# Patient Record
Sex: Female | Born: 1960 | Race: Black or African American | Hispanic: No | Marital: Single | Smoking: Never smoker
Health system: Southern US, Community
[De-identification: ages and names within clinical notes are randomized; demographics above are authoritative.]

## PROBLEM LIST (undated history)

## (undated) DIAGNOSIS — I872 Venous insufficiency (chronic) (peripheral): Secondary | ICD-10-CM

---

## 1998-03-22 ENCOUNTER — Other Ambulatory Visit: Admission: RE | Admit: 1998-03-22 | Discharge: 1998-03-22 | Payer: Self-pay | Admitting: Obstetrics and Gynecology

## 1999-07-04 ENCOUNTER — Other Ambulatory Visit: Admission: RE | Admit: 1999-07-04 | Discharge: 1999-07-04 | Payer: Self-pay | Admitting: Obstetrics and Gynecology

## 2000-07-29 ENCOUNTER — Other Ambulatory Visit: Admission: RE | Admit: 2000-07-29 | Discharge: 2000-07-29 | Payer: Self-pay | Admitting: Obstetrics and Gynecology

## 2001-09-01 ENCOUNTER — Other Ambulatory Visit: Admission: RE | Admit: 2001-09-01 | Discharge: 2001-09-01 | Payer: Self-pay | Admitting: Obstetrics and Gynecology

## 2002-09-07 ENCOUNTER — Other Ambulatory Visit: Admission: RE | Admit: 2002-09-07 | Discharge: 2002-09-07 | Payer: Self-pay | Admitting: Obstetrics and Gynecology

## 2003-10-15 ENCOUNTER — Other Ambulatory Visit: Admission: RE | Admit: 2003-10-15 | Discharge: 2003-10-15 | Payer: Self-pay | Admitting: Obstetrics and Gynecology

## 2017-12-02 ENCOUNTER — Ambulatory Visit (HOSPITAL_COMMUNITY)
Admission: EM | Admit: 2017-12-02 | Discharge: 2017-12-02 | Disposition: A | Discharge status: Home / Self Care | Payer: Self-pay | Attending: Family Medicine | Admitting: Family Medicine

## 2017-12-02 ENCOUNTER — Encounter (HOSPITAL_COMMUNITY): Payer: Self-pay

## 2017-12-02 DIAGNOSIS — M7989 Other specified soft tissue disorders: Secondary | ICD-10-CM

## 2017-12-02 LAB — POCT I-STAT, CHEM 8
BUN: 11 mg/dL (ref 6–20)
CHLORIDE: 102 mmol/L (ref 98–111)
CREATININE: 0.6 mg/dL (ref 0.44–1.00)
Calcium, Ion: 1.21 mmol/L (ref 1.15–1.40)
Glucose, Bld: 94 mg/dL (ref 70–99)
HEMATOCRIT: 31 % — AB (ref 36.0–46.0)
Hemoglobin: 10.5 g/dL — ABNORMAL LOW (ref 12.0–15.0)
POTASSIUM: 4 mmol/L (ref 3.5–5.1)
SODIUM: 137 mmol/L (ref 135–145)
TCO2: 22 mmol/L (ref 22–32)

## 2017-12-02 NOTE — ED Provider Notes (Signed)
[facility]    CSN: [id number] Arrival date & time: 12/02/17  1809     History   Chief Complaint Chief Complaint  Patient presents with  . left leg swelling and swelling in ankle along with a slight     HPI Karmen Stabserrie A Ligman is a 57 y.o. female.   57 year old female comes in with left leg and ankle swelling for the past month that is causing discoloration to the leg.  States swelling improves throughout the night, and worsens during the day.  No significant pain, itching, tightness. States symptoms slightly improved these past few days, but given has been present the past month, came in for evaluation. She denies chest pain, shortness of breath, palpitations, weakness, dizziness, syncope. No recent long travels. No oral birth control use. Never smoker and denies history of drug use. Work requires long hours of standing and walking. Has not taken anything for the symptoms.      History reviewed. No pertinent past medical history.  There are no active problems to display for this patient.   History reviewed. No pertinent surgical history.  OB History   None      Home Medications    Prior to Admission medications   Not on File    Family History History reviewed. No pertinent family history.  Social History Social History   Tobacco Use  . Smoking status: Not on file  Substance Use Topics  . Alcohol use: Not on file  . Drug use: Not on file     Allergies   Patient has no allergy information on record.   Review of Systems Review of Systems  Reason unable to perform ROS: See HPI as above.     Physical Exam Triage Vital Signs ED Triage Vitals  Enc Vitals Group     BP 12/02/17 1825 (!) 177/131     Pulse Rate 12/02/17 1825 (!) 117     Resp 12/02/17 1825 20     Temp 12/02/17 1825 98.6 F (37 C)     Temp Source 12/02/17 1825 Oral     SpO2 12/02/17 1825 100 %     Weight --      Height --      Head Circumference --      Peak Flow --    Pain Score 12/02/17 1823 5     Pain Loc --      Pain Edu? --      Excl. in GC? --    No data found.  Updated Vital Signs BP (!) 168/122   Pulse (!) 117   Temp 98.6 F (37 C) (Oral)   Resp 20   LMP  (LMP Unknown)   SpO2 100%   Physical Exam  Constitutional: She is oriented to person, place, and time. She appears well-developed and well-nourished. No distress.  HENT:  Head: Normocephalic and atraumatic.  Eyes: Pupils are equal, round, and reactive to light. Conjunctivae are normal.  Musculoskeletal:  Swelling of the left ankle with pitting edema up to proximal one third of shin.  No calf swelling.  No erythema, increased warmth.  Hyperpigmentation of bilateral shin, medial aspect of the ankle.  No ulcers seen. No tenderness to palpation of ankle, foot, knee.  Full range of motion of ankle, knee.  Strength normal and equal bilaterally.  Sensation intact and equal bilaterally.  Negative Homans.  Pedal pulse 2+ and equal bilaterally.  Neurological: She is alert and oriented to person, place, and time.  Skin:  She is not diaphoretic.     UC Treatments / Results  Labs (all labs ordered are listed, but only abnormal results are displayed) Labs Reviewed  POCT I-STAT, CHEM 8 - Abnormal; Notable for the following components:      Result Value   Hemoglobin 10.5 (*)    HCT 31.0 (*)    All other components within normal limits    EKG None  Radiology No results found.  Procedures Procedures (including critical care time)  Medications Ordered in UC Medications - No data to display  Initial Impression / Assessment and Plan / UC Course  I have reviewed the triage vital signs and the nursing notes.  Pertinent labs & imaging results that were available during my care of the patient were reviewed by me and considered in my medical decision making (see chart for details).    Discussed case with Dr. Dayton Scrape.  57 year old female with 1 month history of left leg and ankle swelling and  hyperpigmentation of bilateral lower legs.  Swelling improves after elevation.  Patient was at first tachycardic and hypertensive at triage.  During EKG,  tachycardia improved to 104.  EKG shows normal sinus rhythm with 1 to 4 bpm, no acute ST changes.  There is pitting edema of the left lower leg without erythema, increased warmth.  No swelling of the calf, negative Homans.  We will still obtain DVT study tomorrow as scheduled.  Patient to wear compression stockings for possible venous stasis.  Patient to follow-up with PCP for further evaluation and management of possible hypertension.  Can return here in 1 to 2 weeks if unable to obtain PCP up appointment for further evaluation of elevated blood pressure reading.  Strict return precautions given.  Patient expresses understanding and agrees to plan.  Discussed  case with Dr. Dayton Scrape, who agrees to plan.  Final Clinical Impressions(s) / UC Diagnoses   Final diagnoses:  Left leg swelling    ED Prescriptions    None        Belinda Fisher, PA-C 12/02/17 2048

## 2017-12-02 NOTE — Discharge Instructions (Signed)
No alarming signs on exam today.  Your blood pressure and heart rate has decreased slightly.  Please follow-up tomorrow for DVT study to rule out a blood clot in your leg.  Compression stockings to help with swelling and elevation of your leg. As discussed, please follow-up with PCP or vascular for further evaluation and management of leg swelling, discoloration, high blood pressure.  If you cannot follow-up with the PCP in 1 to 2 weeks, you can follow-up here for recheck of blood pressure and possibly starting blood pressure medicine.  If experiencing worsening symptoms, chest pain, shortness of breath, weakness, dizziness, palpitations, go to the emergency department for further evaluation.

## 2017-12-02 NOTE — ED Triage Notes (Signed)
Pt presents with swelling in left ankle and leg ;along with a rash that is causing discoloration.

## 2017-12-03 ENCOUNTER — Ambulatory Visit (HOSPITAL_COMMUNITY)
Admission: RE | Admit: 2017-12-03 | Discharge: 2017-12-03 | Disposition: A | Discharge status: Home / Self Care | Payer: Self-pay | Source: Ambulatory Visit | Attending: Physician Assistant | Admitting: Physician Assistant

## 2017-12-03 DIAGNOSIS — M7989 Other specified soft tissue disorders: Secondary | ICD-10-CM

## 2017-12-03 DIAGNOSIS — R609 Edema, unspecified: Secondary | ICD-10-CM

## 2017-12-03 DIAGNOSIS — R600 Localized edema: Secondary | ICD-10-CM | POA: Insufficient documentation

## 2017-12-03 NOTE — Progress Notes (Signed)
Left lower extremity venous duplex completed. There is no evidence of a DVT or Baker's cyst. There is evidence of emlargement of the inguinal lymph nodes. [place] Electric[place]ia Chrishon Martino, RVS 12/03/2017 9:05 AM

## 2019-05-09 ENCOUNTER — Encounter (HOSPITAL_COMMUNITY): Payer: Self-pay | Admitting: *Deleted

## 2019-05-09 ENCOUNTER — Other Ambulatory Visit: Payer: Self-pay

## 2019-05-09 ENCOUNTER — Ambulatory Visit (HOSPITAL_COMMUNITY)
Admission: EM | Admit: 2019-05-09 | Discharge: 2019-05-09 | Disposition: A | Discharge status: Home / Self Care | Payer: Self-pay | Attending: Family Medicine | Admitting: Family Medicine

## 2019-05-09 DIAGNOSIS — R030 Elevated blood-pressure reading, without diagnosis of hypertension: Secondary | ICD-10-CM

## 2019-05-09 DIAGNOSIS — R42 Dizziness and giddiness: Secondary | ICD-10-CM

## 2019-05-09 HISTORY — DX: Venous insufficiency (chronic) (peripheral): I87.2

## 2019-05-09 MED ORDER — CLONIDINE HCL 0.1 MG PO TABS
ORAL_TABLET | ORAL | Status: AC
  Filled 2019-05-09: qty 1

## 2019-05-09 MED ORDER — CLONIDINE HCL 0.1 MG PO TABS
0.1000 mg | ORAL_TABLET | Freq: Once | ORAL | Status: AC
  Administered 2019-05-09: 0.1 mg via ORAL

## 2019-05-09 MED ORDER — AMLODIPINE BESYLATE 5 MG PO TABS: 5.0000 mg | ORAL_TABLET | Freq: Every day | ORAL | 0 refills | Status: AC

## 2019-05-09 NOTE — ED Notes (Signed)
Dr Mannie Stabile notified of pt's VS and c/o's.

## 2019-05-09 NOTE — Discharge Instructions (Addendum)
Begin taking the blood pressure medicine prescribed.  lease seek prompt medical care if: You have: A very bad (severe) headache that is not helped by medicine. Trouble walking or weakness in your arms and legs. Clear or bloody fluid coming from your nose or ears. Changes in your seeing (vision). Jerky movements that you cannot control (seizure). You throw up (vomit). Your symptoms get worse. You lose balance. Your speech is slurred. You pass out. You are sleepier and have trouble staying awake. The black centers of your eyes (pupils) change in size.  These symptoms may be an emergency. Do not wait to see if the symptoms will go away. Get medical help right away. Call your local emergency services. Do not drive yourself to the hospital.

## 2019-05-09 NOTE — ED Triage Notes (Addendum)
C/O feeling "off balance" x 1 wk; describes as room spinning with any head movements or position changes.  States felt it had improved some, but now getting worse.  Vomiting x 1 this AM.  Reports good appetite. States has had vertigo in past; states sxs are similar. Denies any HA or vision changes, but states she had HA earlier in week.

## 2019-05-11 NOTE — ED Provider Notes (Signed)
[facility]   [id number] 05/09/19 Arrival Time: 1425  ASSESSMENT & PLAN:  1. Dizziness   2. Elevated blood pressure reading without diagnosis of hypertension     Normal neurologic exam. No suspicion for ICH or SAH. No indication for neurodiagnostic imaging at this time. Declines ED evaluation for potential hypertensive urgency. Recheck BP by me after clonidine here: 188/102. Currently without symptoms described below. Tolerating PO fluids.  Will begin: Meds ordered this encounter  Medications  . amLODipine (NORVASC) 5 MG tablet    Sig: Take 1 tablet (5 mg total) by mouth daily.    Dispense:  30 tablet    Refill:  0   Stressed importance of est care with PCP. May recheck BP here tomorrow am if needed.  Agrees to proceed to the ED if she develops other symptoms such as alterations of speech, swallowing, vision, motor/sensory systems, or if dizziness worsens.  Reviewed expectations re: course of current medical issues. Questions answered. Outlined signs and symptoms indicating need for more acute intervention. Patient verbalized understanding. After Visit Summary given.   SUBJECTIVE:  Jocelyn Powers is a 58 y.o. female who reports gradual onset of "feeling dizzy"; describes as lightheadedness; questions mild vertigo. Current symptoms first noted over the past week and have waxed and waned. Experiencing symptoms several times a day with episodes typically lasting a few minutes, maybe longer. Aggravating factors: questions if head movements worsen or cause symptoms. Denies coordination problems, dizziness, gait problems, headaches, memory problems, paresthesia, seizures, speech problems, tremors and weakness as well as otalgia, tinnitus and hearing loss. Recent infections: none. Head trauma: denied. Noise exposure: none. No associated SOB, CP, or palpatations reported. Recent travel: none. Reports normal bowel/bladder habits. Therapies tried thus far: none. No new  medications.  Social History   Substance and Sexual Activity  Alcohol Use Not Currently   Social History   Tobacco Use  Smoking Status Never Smoker  Smokeless Tobacco Never Used   Denies illegal drug use.  Increased blood pressure noted today. Reports that she has not been treated for hypertension in the past. "I've been told my blood pressure was really high before".  She reports no chest pain on exertion, no dyspnea on exertion, no swelling of ankles, no orthostatic dizziness or lightheadedness, no orthopnea or paroxysmal nocturnal dyspnea, no palpitations and no intermittent claudication symptoms.   ROS: As per HPI. All other systems negative.    OBJECTIVE:  Vitals:   05/09/19 1534 05/09/19 1537  BP: (!) 198/119 (!) 200/120  Pulse: (!) 112   Resp: 18   Temp: 98.2 F (36.8 C)   TempSrc: Oral   SpO2: 100%     General appearance: alert; no distress Eyes: PERRLA; EOMI; conjunctiva normal HENT: normocephalic; atraumatic; TMs normal; nasal mucosa normal; oral mucosa normal Neck: supple with FROM Lungs: clear to auscultation bilaterally Heart:initial tachycardia (recheck before discharge by me 94 and regular) Abdomen: soft, non-tender; bowel sounds normal Extremities: no cyanosis or edema; symmetrical with no gross deformities Skin: warm and dry Neurologic: normal gait; DTR's normal and symmetric; CN 2-12 grossly intact; rapid changes in position during the exam do precipitate mild symptoms Psychological: alert and cooperative; normal mood and affect    No Known Allergies  Past Medical History:  Diagnosis Date  . Venous insufficiency    Social History   Socioeconomic History  . Marital status: Single    Spouse name: Not on file  . Number of children: Not on file  . Years of education:  Not on file  . Highest education level: Not on file  Occupational History  . Not on file  Social Needs  . Financial resource strain: Not on file  . Food insecurity     Worry: Not on file    Inability: Not on file  . Transportation needs    Medical: Not on file    Non-medical: Not on file  Tobacco Use  . Smoking status: Never Smoker  . Smokeless tobacco: Never Used  Substance and Sexual Activity  . Alcohol use: Not Currently  . Drug use: Never  . Sexual activity: Not on file  Lifestyle  . Physical activity    Days per week: Not on file    Minutes per session: Not on file  . Stress: Not on file  Relationships  . Social Herbalist on phone: Not on file    Gets together: Not on file    Attends religious service: Not on file    Active member of club or organization: Not on file    Attends meetings of clubs or organizations: Not on file    Relationship status: Not on file  . Intimate partner violence    Fear of current or ex partner: Not on file    Emotionally abused: Not on file    Physically abused: Not on file    Forced sexual activity: Not on file  Other Topics Concern  . Not on file  Social History Narrative  . Not on file   Family History  Problem Relation Age of Onset  . Hypertension Mother   . Diabetes Mother   . Hypertension Father   . Heart attack Father    History reviewed. No pertinent surgical history.    Vanessa Kick, MD 05/13/19 [phone]

## 2019-05-15 ENCOUNTER — Emergency Department (HOSPITAL_COMMUNITY): Payer: Self-pay

## 2019-05-15 ENCOUNTER — Other Ambulatory Visit: Payer: Self-pay

## 2019-05-15 ENCOUNTER — Emergency Department (HOSPITAL_COMMUNITY)
Admission: EM | Admit: 2019-05-15 | Discharge: 2019-06-05 | Disposition: E | Payer: Self-pay | Attending: Emergency Medicine | Admitting: Emergency Medicine

## 2019-05-15 DIAGNOSIS — I619 Nontraumatic intracerebral hemorrhage, unspecified: Secondary | ICD-10-CM

## 2019-05-15 DIAGNOSIS — Z79899 Other long term (current) drug therapy: Secondary | ICD-10-CM | POA: Insufficient documentation

## 2019-05-15 DIAGNOSIS — Z20828 Contact with and (suspected) exposure to other viral communicable diseases: Secondary | ICD-10-CM | POA: Insufficient documentation

## 2019-05-15 LAB — POCT I-STAT 7, (LYTES, BLD GAS, ICA,H+H)
Acid-base deficit: 1 mmol/L (ref 0.0–2.0)
Bicarbonate: 22.5 mmol/L (ref 20.0–28.0)
Calcium, Ion: 1.2 mmol/L (ref 1.15–1.40)
HCT: 40 % (ref 36.0–46.0)
Hemoglobin: 13.6 g/dL (ref 12.0–15.0)
O2 Saturation: 98 %
Patient temperature: 98
Potassium: 3 mmol/L — ABNORMAL LOW (ref 3.5–5.1)
Sodium: 134 mmol/L — ABNORMAL LOW (ref 135–145)
TCO2: 23 mmol/L (ref 22–32)
pCO2 arterial: 32.1 mmHg (ref 32.0–48.0)
pH, Arterial: 7.452 — ABNORMAL HIGH (ref 7.350–7.450)
pO2, Arterial: 103 mmHg (ref 83.0–108.0)

## 2019-05-15 LAB — CBC WITH DIFFERENTIAL/PLATELET
Abs Immature Granulocytes: 0.13 10*3/uL — ABNORMAL HIGH (ref 0.00–0.07)
Basophils Absolute: 0.1 10*3/uL (ref 0.0–0.1)
Basophils Relative: 1 %
Eosinophils Absolute: 0.6 10*3/uL — ABNORMAL HIGH (ref 0.0–0.5)
Eosinophils Relative: 4 %
HCT: 47.5 % — ABNORMAL HIGH (ref 36.0–46.0)
Hemoglobin: 14.2 g/dL (ref 12.0–15.0)
Immature Granulocytes: 1 %
Lymphocytes Relative: 16 %
Lymphs Abs: 2.3 10*3/uL (ref 0.7–4.0)
MCH: 22.9 pg — ABNORMAL LOW (ref 26.0–34.0)
MCHC: 29.9 g/dL — ABNORMAL LOW (ref 30.0–36.0)
MCV: 76.7 fL — ABNORMAL LOW (ref 80.0–100.0)
Monocytes Absolute: 1.2 10*3/uL — ABNORMAL HIGH (ref 0.1–1.0)
Monocytes Relative: 8 %
Neutro Abs: 10.4 10*3/uL — ABNORMAL HIGH (ref 1.7–7.7)
Neutrophils Relative %: 70 %
Platelets: 711 10*3/uL — ABNORMAL HIGH (ref 150–400)
RBC: 6.19 MIL/uL — ABNORMAL HIGH (ref 3.87–5.11)
RDW: 18 % — ABNORMAL HIGH (ref 11.5–15.5)
WBC: 14.7 10*3/uL — ABNORMAL HIGH (ref 4.0–10.5)
nRBC: 0 % (ref 0.0–0.2)

## 2019-05-15 LAB — COMPREHENSIVE METABOLIC PANEL
ALT: 85 U/L — ABNORMAL HIGH (ref 0–44)
AST: 68 U/L — ABNORMAL HIGH (ref 15–41)
Albumin: 3.9 g/dL (ref 3.5–5.0)
Alkaline Phosphatase: 211 U/L — ABNORMAL HIGH (ref 38–126)
Anion gap: 14 (ref 5–15)
BUN: 10 mg/dL (ref 6–20)
CO2: 23 mmol/L (ref 22–32)
Calcium: 9.7 mg/dL (ref 8.9–10.3)
Chloride: 99 mmol/L (ref 98–111)
Creatinine, Ser: 0.64 mg/dL (ref 0.44–1.00)
GFR calc Af Amer: >60 mL/min (ref >60)
GFR calc non Af Amer: >60 mL/min (ref >60)
Glucose, Bld: 162 mg/dL — ABNORMAL HIGH (ref 70–99)
Potassium: 3.6 mmol/L (ref 3.5–5.1)
Sodium: 136 mmol/L (ref 135–145)
Total Bilirubin: 1 mg/dL (ref 0.3–1.2)
Total Protein: 9.3 g/dL — ABNORMAL HIGH (ref 6.5–8.1)

## 2019-05-15 LAB — CBG MONITORING, ED: Glucose-Capillary: 165 mg/dL — ABNORMAL HIGH (ref 70–99)

## 2019-05-15 LAB — TRIGLYCERIDES: Triglycerides: 95 mg/dL (ref <150)

## 2019-05-15 LAB — RESPIRATORY PANEL BY RT PCR (FLU A&B, COVID)
Influenza A by PCR: NEGATIVE
Influenza B by PCR: NEGATIVE
SARS Coronavirus 2 by RT PCR: NEGATIVE

## 2019-05-15 MED ORDER — FENTANYL CITRATE (PF) 100 MCG/2ML IJ SOLN: 100.0000 ug | INTRAMUSCULAR | Status: DC | PRN

## 2019-05-15 MED ORDER — ETOMIDATE 2 MG/ML IV SOLN
INTRAVENOUS | Status: AC | PRN
  Administered 2019-05-15: 30 mg via INTRAVENOUS

## 2019-05-15 MED ORDER — FENTANYL CITRATE (PF) 100 MCG/2ML IJ SOLN
100.0000 ug | INTRAMUSCULAR | Status: DC | PRN
  Filled 2019-05-15: qty 2

## 2019-05-15 MED ORDER — MORPHINE SULFATE (PF) 2 MG/ML IV SOLN
2.0000 mg | INTRAVENOUS | Status: DC | PRN
  Administered 2019-05-15: 19:00:00 2 mg via INTRAVENOUS
  Filled 2019-05-15: qty 1

## 2019-05-15 MED ORDER — ROCURONIUM BROMIDE 50 MG/5ML IV SOLN
INTRAVENOUS | Status: AC | PRN
  Administered 2019-05-15: 80 mg via INTRAVENOUS

## 2019-05-15 MED ORDER — LORAZEPAM 2 MG/ML IJ SOLN
1.0000 mg | INTRAMUSCULAR | Status: DC | PRN
  Administered 2019-05-15: 1 mg via INTRAVENOUS
  Filled 2019-05-15: qty 1

## 2019-05-15 MED ORDER — PROPOFOL 1000 MG/100ML IV EMUL: 0.0000 ug/kg/min | INTRAVENOUS | Status: DC

## 2019-06-05 NOTE — ED Notes (Signed)
[facility] referral number [id number], Jocelyn Powers

## 2019-06-05 NOTE — Progress Notes (Signed)
Pt compassionately extubated per MD order. Family, chaplain and RN at bedside.

## 2019-06-05 NOTE — ED Provider Notes (Addendum)
[facility] Provider Note   CSN: [id number] Arrival date & time: May 17, 2019  1414     History Chief Complaint  Patient presents with  . Altered Mental Status    Jocelyn Powers is a 59 y.o. female.  HPI Level 5 caveat due to unresponsiveness. Patient came in by EMS.  Reported GCS of 4.  Had reportedly complaining of a headache then dizziness.  However mental status decreased.  Reportedly went bradycardic and had to get atropine.  Also had Narcan for decreased mental status.  Upon arrival hypertensive.  CBG was mildly elevated.  Recently started on Norvasc by an urgent care for dizziness and headache      Past Medical History:  Diagnosis Date  . Venous insufficiency     There are no problems to display for this patient.   No past surgical history on file.   OB History   No obstetric history on file.     Family History  Problem Relation Age of Onset  . Hypertension Mother   . Diabetes Mother   . Hypertension Father   . Heart attack Father     Social History   Tobacco Use  . Smoking status: Never Smoker  . Smokeless tobacco: Never Used  Substance Use Topics  . Alcohol use: Not Currently  . Drug use: Never    Home Medications Prior to Admission medications   Medication Sig Start Date End Date Taking? Authorizing Provider  amLODipine (NORVASC) 5 MG tablet Take 1 tablet (5 mg total) by mouth daily. 05/09/19   Vanessa Kick, MD    Allergies    Patient has no known allergies.  Review of Systems   Review of Systems  Unable to perform ROS: Patient unresponsive    Physical Exam Updated Vital Signs BP (!) 131/94   Pulse (!) 132   Temp (!) 97.3 F (36.3 C) (Temporal)   Resp 20   Ht 5\' 7"  (1.702 m)   Wt 99 kg   SpO2 100%   BMI 34.18 kg/m   Physical Exam Vitals reviewed.  HENT:     Head:     Comments: Lipoma versus hematoma above right eye.    Mouth/Throat:     Mouth: Mucous membranes are moist.  Eyes:    Comments: Pupils sluggishly reactive.  However no corneal reflex and no threat reflex.  Cardiovascular:     Rate and Rhythm: Tachycardia present.  Pulmonary:     Comments: Breathing spontaneously. Abdominal:     General: There is no distension.  Musculoskeletal:        General: No signs of injury.  Neurological:     Comments: Unresponsive.  No gag reflex.  No corneal reflex.  No response to pain on any of 4 extremities.     ED Results / Procedures / Treatments   Labs (all labs ordered are listed, but only abnormal results are displayed) Labs Reviewed  COMPREHENSIVE METABOLIC PANEL - Abnormal; Notable for the following components:      Result Value   Glucose, Bld 162 (*)    Total Protein 9.3 (*)    AST 68 (*)    ALT 85 (*)    Alkaline Phosphatase 211 (*)    All other components within normal limits  CBC WITH DIFFERENTIAL/PLATELET - Abnormal; Notable for the following components:   WBC 14.7 (*)    RBC 6.19 (*)    HCT 47.5 (*)    MCV 76.7 (*)    [facility]  22.9 (*)    MCHC 29.9 (*)    RDW 18.0 (*)    Platelets 711 (*)    Neutro Abs 10.4 (*)    Monocytes Absolute 1.2 (*)    Eosinophils Absolute 0.6 (*)    Abs Immature Granulocytes 0.13 (*)    All other components within normal limits  CBG MONITORING, ED - Abnormal; Notable for the following components:   Glucose-Capillary 165 (*)    All other components within normal limits  POCT I-STAT 7, (LYTES, BLD GAS, ICA,H+H) - Abnormal; Notable for the following components:   pH, Arterial 7.452 (*)    Sodium 134 (*)    Potassium 3.0 (*)    All other components within normal limits  RESPIRATORY PANEL BY RT PCR (FLU A&B, COVID)  TRIGLYCERIDES  ETHANOL  URINALYSIS, ROUTINE W REFLEX MICROSCOPIC    EKG None  Radiology CT Head Wo Contrast  Result Date: 07-May-2019 CLINICAL DATA:  59 year old female with sudden onset headache and dizziness 13 20, became unresponsive. Bradycardia. Now intubated. EXAM: CT HEAD WITHOUT CONTRAST  TECHNIQUE: Contiguous axial images were obtained from the base of the skull through the vertex without intravenous contrast. COMPARISON:  None. FINDINGS: Brain: Hyperdense hemorrhage in the posterior fossa appears to originate from intra-axial blood within the left cerebellum, although there may be a small volume of subarachnoid extension into the cerebellar Foley I. There is severe edema in the central cerebellum and complete loss of basilar cisterns with mass effect on the brainstem. Brainstem edema suspected. Completely effaced only 4th ventricle mild, although enlargement of the lateral and 3rd ventricles at this time. There is no lateral or 3rd intraventricular hemorrhage. No obvious 4th intraventricular hemorrhage. No supratentorial midline shift. Partially empty sella. Supratentorial gray-white matter differentiation preserved at this time. Vascular: No suspicious intracranial vascular hyperdensity. Skull: No osseous abnormality identified. Sinuses/Orbits: Paranasal sinuses and mastoids are well pneumatized. Other: Trace fluid in the pharynx. Partially visible endotracheal or oral enteric tube. No acute orbit or scalp soft tissue finding. Small benign right anterior frontal convexity scalp lipoma. IMPRESSION: 1. Acute cerebellar hemorrhage on the left with moderate to severe cerebellar and brainstem edema and complete loss of all basilar cisterns, 4th ventricle. Intra-axial hemorrhage origin is suspected although there appears to be trace subarachnoid blood in the cerebellar folia. Top differential considerations include hypertensive hemorrhage, underlying hemorrhagic tumor or malformation. 2. Only mild lateral and 3rd ventriculomegaly suspected at this time, and no IVH is evident. 3. Critical Value/emergent results were discussed by telephone at the time of interpretation on 07-May-2019 at 1457 hours to Dr. Benjiman CoreNATHAN Idara Woodside , who verbally acknowledged these results. Electronically Signed   By: Odessa FlemingH  Hall M.D.    On: 07-May-2019 15:09   DG Chest Portable 1 View  Result Date: 07-May-2019 CLINICAL DATA:  Intubated. EXAM: PORTABLE CHEST 1 VIEW COMPARISON:  None. FINDINGS: Endotracheal tube tip at the origin of the right mainstem bronchus. Poor inspiration with prominent hila bilaterally. Mildly enlarged heart. Clear lungs. Lower thoracic spine degenerative changes. Nasogastric tube tip and side hole in the proximal stomach. IMPRESSION: 1. Endotracheal tube tip at the origin of the right mainstem bronchus. It is recommended that this be retracted 4 cm. 2. Prominent bilateral hila, most likely due to vascular prominence with a poor inspiration. Recommend further evaluation with PA and lateral chest radiographs when feasible. 3. Mild cardiomegaly. Electronically Signed   By: Beckie SaltsSteven  Reid M.D.   On: 07-May-2019 14:57    Procedures Procedure Name: Intubation Date/Time: 07-May-2019 3:58  PM Performed by: Benjiman Core, MD Pre-anesthesia Checklist: Patient identified Oxygen Delivery Method: Ambu bag Induction Type: Rapid sequence Ventilation: Mask ventilation without difficulty Laryngoscope Size: Glidescope and 4 Tube type: Subglottic suction tube Tube size: 7.5 mm Number of attempts: 1 Placement Confirmation: ETT inserted through vocal cords under direct vision,  Positive ETCO2 and Breath sounds checked- equal and bilateral Secured at: 23 cm Dental Injury: Teeth and Oropharynx as per pre-operative assessment  Comments: Patient had a leak in her balloon.  May have been due to metal in her dental area on the right side.  Initially attempted changed with the tube exchanger was unsuccessful and reintubated with the glide scope.  Initially somewhat deep and was removed pulled out after the x-ray.      (including critical care time)  Medications Ordered in ED Medications  propofol (DIPRIVAN) 1000 MG/100ML infusion (has no administration in time range)  fentaNYL (SUBLIMAZE) injection 100 mcg (has no  administration in time range)  fentaNYL (SUBLIMAZE) injection 100 mcg (has no administration in time range)  etomidate (AMIDATE) injection (30 mg Intravenous Given 2019/05/20 1423)  rocuronium (ZEMURON) injection (80 mg Intravenous Given 05/20/19 1424)    ED Course  I have reviewed the triage vital signs and the nursing notes.  Pertinent labs & imaging results that were available during my care of the patient were reviewed by me and considered in my medical decision making (see chart for details).    MDM Rules/Calculators/A&P     CHA2DS2/VAS Stroke Risk Points      N/A >= 2 Points: High Risk  1 - 1.99 Points: Medium Risk  0 Points: Low Risk    A final score could not be computed because of missing components.: Last  Change: N/A     This score determines the patient's risk of having a stroke if the  patient has atrial fibrillation.      This score is not applicable to this patient. Components are not  calculated.                   Patient came in altered mental status.  GCS initially 3 with no corneal reflex gag reflex or throat reflex.  No response to pain.  Intubated by myself but then required intubation again after the balloon on the ET tube was not holding air.  Taken to CT scan which showed large cerebellar bleed.  Discussed with neurology and neurosurgery.  Nonsurgical.  Will be fatal.  Discussed with patient's husband and family.  Patient is a DNR at this point.  May need full palliative care/terminal wean.   CRITICAL CARE Performed by: Benjiman Core Total critical care time:30 minutes Critical care time was exclusive of separately billable procedures and treating other patients. Critical care was necessary to treat or prevent imminent or life-threatening deterioration. Critical care was time spent personally by me on the following activities: development of treatment plan with patient and/or surrogate as well as nursing, discussions with consultants, evaluation of  patient's response to treatment, examination of patient, obtaining history from patient or surrogate, ordering and performing treatments and interventions, ordering and review of laboratory studies, ordering and review of radiographic studies, pulse oximetry and re-evaluation of patient's condition.  Final Clinical Impression(s) / ED Diagnoses Final diagnoses:  Hemorrhagic stroke [facility])    Rx / DC Orders ED Discharge Orders    None       Benjiman Core, MD 2019-05-20 1600    Benjiman Core, MD 05/25/19 [phone]

## 2019-06-05 NOTE — Progress Notes (Signed)
Initiated a relationship of care and concern with husband and son and two other family members.  Attended family for two hours while they determined if they were ready to initiate a DNR then while they were at Mrs. Chao's bedside after extubation until she expired. Read scripture and offered prayers at bedside with Mr. Pangle and son present.  Escorted family from ED when they were ready to depart.  De Burrs Chaplain Resident

## 2019-06-05 NOTE — ED Notes (Signed)
MD made aware of BP changes

## 2019-06-05 NOTE — ED Triage Notes (Signed)
Pt here from home with c/o sudden h/a around  1320 with dizziness and htn , pt became unresponsive with ems gcs 3 , pt received .4 of narcan without response ,and atropine 0.5 mg for bradycardia

## 2019-06-05 NOTE — Progress Notes (Signed)
Pt arrived via EMS on NRB unresponsive. Pt intubated per MD and RT with a MAC 3 glide, 7.5 ETT. BBS heard, positive CO2 detector, direct visualization of vocal cords noted. Pt placed on mechanical ventilation and a decreased minute ventilation was noted. Cuff pressure on ETT checked and MD notified of cuff being blown. MD and RT replaced ETT via bougie and 7.5 ETT. CXR confirmed and ETT pulled back 2cm. Metal noted to be sticking out of back molars and assumed to be the cause of the blown cuff. No other apparent complications, SATs 333%.

## 2019-06-05 NOTE — ED Provider Notes (Addendum)
Brief update note  Summary:  59 year old lady presented to ER unresponsive, poor respiratory effort.  CT head demonstrating large cerebellar bleed.  Consulted with neurology and neurosurgery.  No NSGY intervention indicated, not survivable injury.  Initial EDP discussed with family, patient made DNR and interested in palliative extubation, but they wanted to hold off until additional family able to come to bedside.  I had further discussion later in the evening with husband and son.  They would like to proceed with palliative extubation at this time.  Patient has not received any further sedating or paralyzing meds, remains GCS 3.   Plan: Will ask RT to proceed with palliative extubation Ordered as needed morphine and Ativan for any air hunger or anxiety RN to notify CDS    Lucrezia Starch, MD 2019/06/11 1833    Addendum notified by RN patient had died, went to bedside, no neurologic response, no respirations, no heart sounds, no pulse, patient pronounced dead at 7:04 PM.  Husband at bedside and notified.   Lucrezia Starch, MD 11-Jun-2019 1911   ADDENDUM 8:14 PM - spoke to Kenton Kingfisher, Medical Examiner, patient can be transported downstairs, he will see patient tonight or tomorrow morning and will complete death certificate at that time given patient has no PCP    Lucrezia Starch, MD 2019/06/11 2014    Lucrezia Starch, MD 06/11/2019 2015

## 2019-06-05 NOTE — Consult Note (Signed)
Neurosurgery Consultation  Reason for Consult: Posterior fossa / cerebellar hemorrhage Referring Physician: Alvino Chapel  CC: Altered mental status  HPI: This is a 59 y.o. woman that presents with roughly 1 week of ataxia and dizziness, was neurologically intact at that time, then had acute onset dizziness and hypertension followed by obtundation. EMS noted GCS of 3 upon arrival, no response to naloxone, noted to be bradycardic at that time. Upon arrival to the ED here, was GCS 3 and intubated, HTNive and bradycardic. No known use of anti-platelet or anti-coagulant medications. CTH obtained, NSGY consulted regarding findings.   ROS: A 14 point ROS was performed and is negative except as noted in the HPI.   PMHx:  Past Medical History:  Diagnosis Date  . Venous insufficiency    FamHx:  Family History  Problem Relation Age of Onset  . Hypertension Mother   . Diabetes Mother   . Hypertension Father   . Heart attack Father    SocHx:  reports that she has never smoked. She has never used smokeless tobacco. She reports previous alcohol use. She reports that she does not use drugs.  Exam: Vital signs in last 24 hours: Temp:  [97.3 F (36.3 C)] 97.3 F (36.3 C) (12/11 1428) Pulse Rate:  [119-140] 132 (12/11 1455) Resp:  [11-30] 20 (12/11 1455) BP: (130-268)/(86-201) 131/94 (12/11 1455) SpO2:  [97 %-100 %] 100 % (12/11 1455) FiO2 (%):  [40 %] 40 % (12/11 1425) Weight:  [99 kg] 99 kg (12/11 1430) General: In hospital gurney, appears acutely ill Head: normocephalic and atruamatic HEENT: neck supple Pulmonary: Intubated, good chest rise bilaterally Cardiac: tachycardic, regular rate Abdomen: S NT ND Extremities: warm and well perfused x4 with some chronic toenail changes in LLE Neuro: Intubated, no sedation, last got rocuronium for intubation >90 minutes ago and no reported h/o hepatorenal disease, pupils fixed and dilated, no corneals, no cough, no gag reflex, no response to painful  stimulation x4 extremities. Becoming hypotensive during examination.  Assessment and Plan: 59 y.o. woman with acute onset obtundation. [place] personally reviewed, which shows hypodensity to the right of midline in the cerebellum that continues into brainstem with hypodensity in the medulla, pons, and caudal midbrain. Hyperdensity present in the cerebellar parenchyma with likely SAH in the cerebellar folia on the left, effacement of cisterns, upward herniation with mass effect on the tentorium and brain compression. GCS of 3 on exam, ICH score of 3 (posterior fossa +1, GCS +2)  -No acute neurosurgical intervention indicated at this time. Unfortunately I do not think that posterior fossa decompression will be beneficial with her poor neurologic exam. She has no brainstem reflexes and is now transitioning from hypertension to becoming hypotensive, which is often the marker of the last stage of herniation and brain death in patients with this pathology. I did inform the family that this is not a survivable injury.  I discussed the above with the family and the patient's son had a fairly strong emotional reaction that halted the conversation, so they may have more questions that they would like to discuss later. If so, let me know and I can speak with them.  -Call with any concerns or questions  Judith Part, MD 05-18-19 3:47 PM [facility]

## 2019-06-05 NOTE — Consult Note (Signed)
Neurohospitalist Consult Note    Chief Complaint: Acute onset of unresponsiveness  HPI: Jocelyn Powers is an 59 y.o. female presenting via EMS after they were called to her home for sudden onset of headache beginning at 1320, in conjunction with dizziness. She became unresponsive with EMS to a GCS of 3. She received 4 of Narcan without response. Also was given atropine 0.5 mg for bradycardia. On arrival to the ED she was on NRB mask and continued to be unresponsive. Exam by EDP revealed no brainstem reflexes except for breathing on her own. She required emergent intubation. Her SBP was 270 prior to intubation. BP since than has decreased to 75/48. STAT CT head revealed a left cerebellar hemorrhage with adjacent edema and mass effect on the 4th ventricle and brainstem. The appearance is most consistent with imminent transtentorial upward herniation versus tonsillar/downward cerebellar herniation.   Past Medical History:  Diagnosis Date  . Venous insufficiency     No past surgical history on file.  Family History  Problem Relation Age of Onset  . Hypertension Mother   . Diabetes Mother   . Hypertension Father   . Heart attack Father    Social History:  reports that she has never smoked. She has never used smokeless tobacco. She reports previous alcohol use. She reports that she does not use drugs.  Allergies: No Known Allergies  Home Medications: Amlodipine  ROS: Unable to obtain due to coma  Physical Examination: Blood pressure (!) 131/94, pulse (!) 132, temperature (!) 97.3 F (36.3 C), temperature source Temporal, resp. rate 20, height 5\' 7"  (1.702 m), weight 99 kg, SpO2 100 %.  HEENT-  [place]/AT  Lungs - Intubated Extremities - No cyanosis or pallor  Neurologic Examination: Mental Status: Exam performed 45 minutes after intubation with paralytic. Per pharmacy, the paralytic is most likely completely worn off at the time of the Neurological assessment. GCS 3. No responses to any  external stimuli.  Cranial Nerves: II:  No blink to threat. Pupils fixed and dilated bilaterally at 7 mm.  III,IV, VI: Eyelids are closed. When passively opened, there is no resistance or blinking. Eyes conjugate at the midline. No oculocephalic reflex.  V,VII: Face flaccidly symmetric. No corneal reflexes.  VIII: No response to auditory stimuli.  IX,X: No gag or cough reflex.  XI: Unable to assess.  XII: Intubated.  Motor/Sensory: Flaccid tone x 4 in extension and flexion. No movement spontaneously or to any stimuli.  Deep Tendon Reflexes:  Absent reflexes in all 4 extremities. Toes mute bilaterally  Cerebellar/Gait: Unable to assess    Results for orders placed or performed during the hospital encounter of 05-21-2019 (from the past 48 hour(s))  CBG monitoring, ED     Status: Abnormal   Collection Time: 2019-05-21  2:21 PM  Result Value Ref Range   Glucose-Capillary 165 (H) 70 - 99 mg/dL   Comment 1 Notify RN    Comment 2 Document in Chart   CBC with Differential     Status: Abnormal   Collection Time: May 21, 2019  2:25 PM  Result Value Ref Range   WBC 14.7 (H) 4.0 - 10.5 K/uL   RBC 6.19 (H) 3.87 - 5.11 MIL/uL   Hemoglobin 14.2 12.0 - 15.0 g/dL   HCT 14/11/20 (H) 07.3 - 71.0 %   MCV 76.7 (L) 80.0 - 100.0 fL   MCH 22.9 (L) 26.0 - 34.0 pg   MCHC 29.9 (L) 30.0 - 36.0 g/dL   RDW 62.6 (H) 94.8 - 54.6 %  Platelets 711 (H) 150 - 400 K/uL   nRBC 0.0 0.0 - 0.2 %   Neutrophils Relative % 70 %   Neutro Abs 10.4 (H) 1.7 - 7.7 K/uL   Lymphocytes Relative 16 %   Lymphs Abs 2.3 0.7 - 4.0 K/uL   Monocytes Relative 8 %   Monocytes Absolute 1.2 (H) 0.1 - 1.0 K/uL   Eosinophils Relative 4 %   Eosinophils Absolute 0.6 (H) 0.0 - 0.5 K/uL   Basophils Relative 1 %   Basophils Absolute 0.1 0.0 - 0.1 K/uL   Immature Granulocytes 1 %   Abs Immature Granulocytes 0.13 (H) 0.00 - 0.07 K/uL    Comment: Performed at [facility] [address] [place], [place] [address]   DG Chest Portable 1  View  Result Date: 05/30/19 CLINICAL DATA:  Intubated. EXAM: PORTABLE CHEST 1 VIEW COMPARISON:  None. FINDINGS: Endotracheal tube tip at the origin of the right mainstem bronchus. Poor inspiration with prominent hila bilaterally. Mildly enlarged heart. Clear lungs. Lower thoracic spine degenerative changes. Nasogastric tube tip and side hole in the proximal stomach. IMPRESSION: 1. Endotracheal tube tip at the origin of the right mainstem bronchus. It is recommended that this be retracted 4 cm. 2. Prominent bilateral hila, most likely due to vascular prominence with a poor inspiration. Recommend further evaluation with PA and lateral chest radiographs when feasible. 3. Mild cardiomegaly. Electronically Signed   By: Claudie Revering M.D.   On: 2019/05/30 14:57   CT head: 1. Acute cerebellar hemorrhage on the left with moderate to severe cerebellar and brainstem edema and complete loss of all basilar cisterns, 4th ventricle. Intra-axial hemorrhage origin is suspected although there appears to be trace subarachnoid blood in the cerebellar folia. Top differential considerations include hypertensive hemorrhage, underlying hemorrhagic tumor or malformation.  2. Only mild lateral and 3rd ventriculomegaly suspected at this time, and no IVH is evident.  Assessment: 59 year old female presenting with large left cerebellar ICH with mass effect on the brainstem and CT findings most consistent with imminent herniation.  1. On initial exam by EDP prior to intubation, the only finding consistent with intact brainstem function was spontaneous breathing. Was unresponsive to all external stimuli.  2. Following intubation and 45 minutes after administration of paralytic, no brainstem reflexes are elicitable. Patient remains unresponsive to all external stimuli with GCS 3.  3. CT head with acute cerebellar hemorrhage on the left with moderate to severe cerebellar and brainstem edema and complete loss of all basilar cisterns  and 4th ventricle.  Recommendations: 1. Neurosurgery has viewed the CT images and performed a consultation. Not a surgical candidate due to futility in the setting of a dismal prognosis.  2. I agree with the assessment by Neurosurgery.  3. I have discussed option for full medical care with the family, to include hypertonic saline and BP management, although I do not think that treatment will alter prognosis. 4. Family is currently discussing privately whether to pursue maximum medical therapy or to withdraw care/pursue palliative care.   60 minutes spent in the neurological evaluation and management of this critically ill patient. Time spent included family conferences and coordination of care.    Electronically signed: Dr. Kerney Elbe 30-May-2019, 3:10 PM

## 2019-06-05 DEATH — deceased

## 2021-05-15 IMAGING — CT CT HEAD W/O CM
4 series · 16 of 47 positions shown, 18 images · non-contrast
Comparison: None.

CLINICAL DATA: 58-year-old female with sudden onset headache and
dizziness 13 20, became unresponsive. Bradycardia. Now intubated.

EXAM:
CT HEAD WITHOUT CONTRAST
TECHNIQUE: Contiguous axial images were obtained from the base of the skull
through the vertex without intravenous contrast.

[Series 2: head without · axial · non-contrast · 0.44mm/px · z∈[+1180,+1305]mm · 7 of 35 slices shown, 9 images]
[im 5/35  brain]
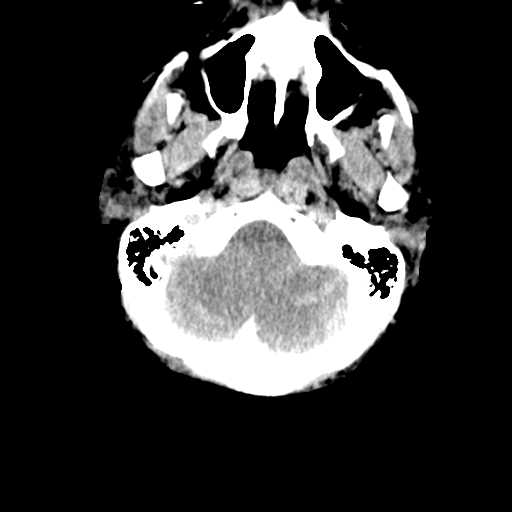
[im 5/35  bone]
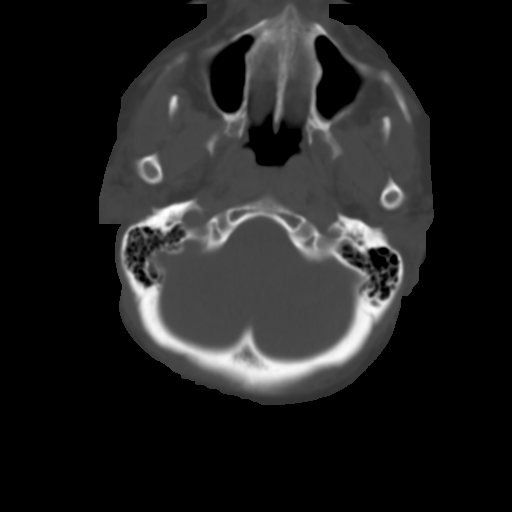
[im 9/35  brain]
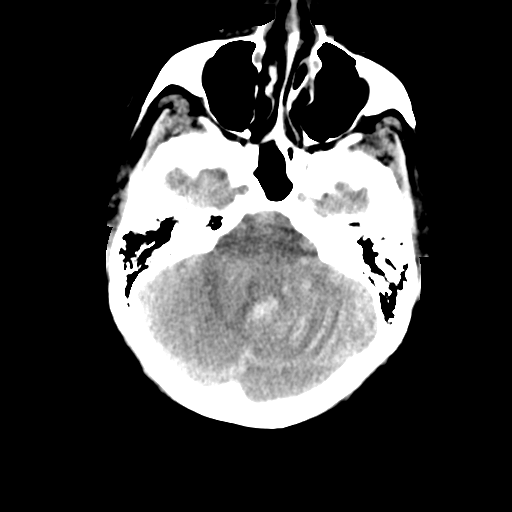
[im 13/35  brain]
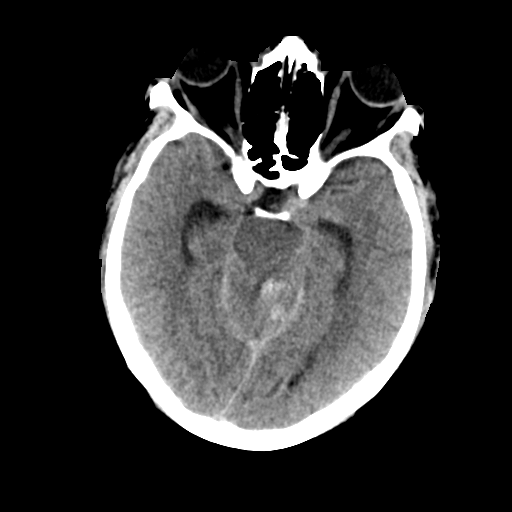
[im 18/35  brain]
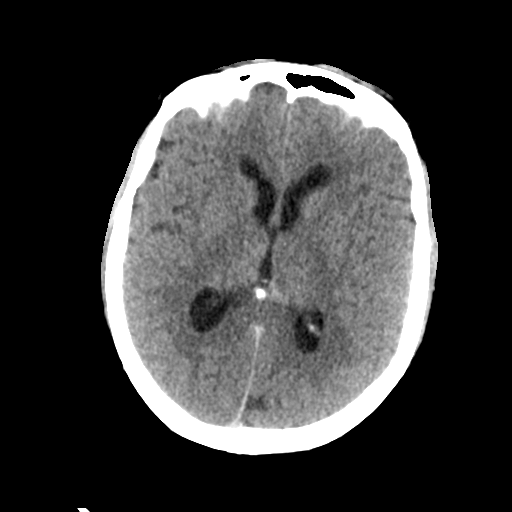
[im 22/35  brain]
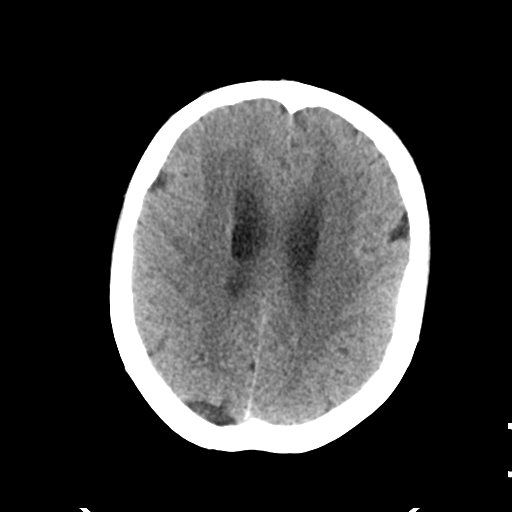
[im 22/35  bone]
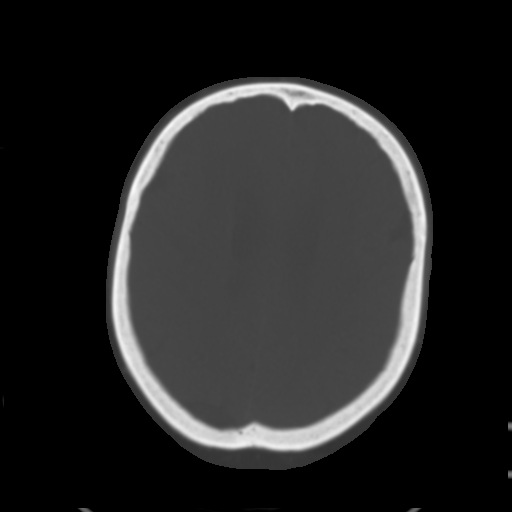
[im 26/35  brain]
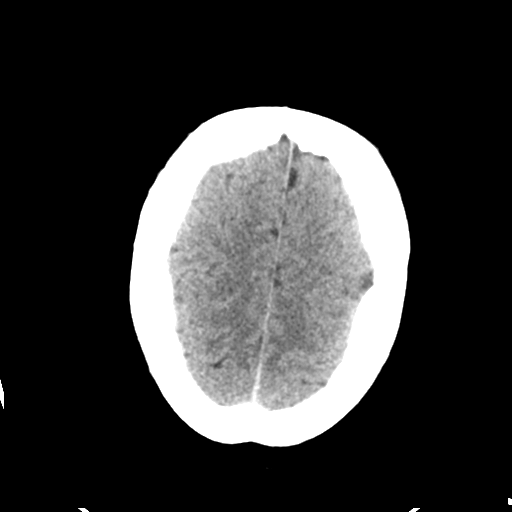
[im 30/35  brain]
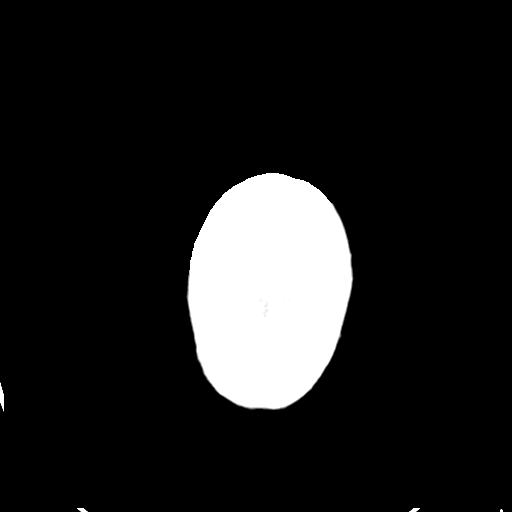

[Series 3: head bone · axial · 0.44mm/px · z∈[+1176,+1210]mm · 3 of 87 slices shown]
[im 9/87  bone]
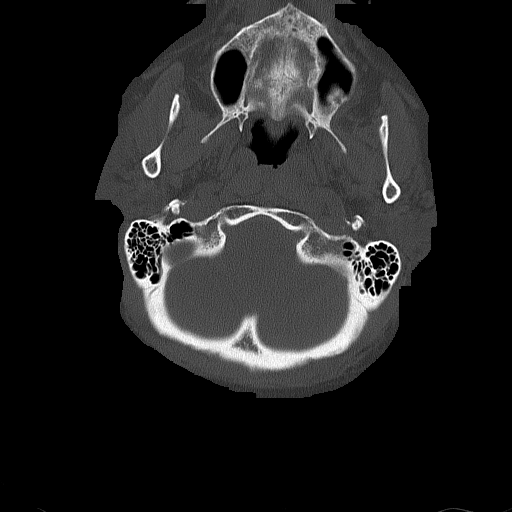
[im 18/87  bone]
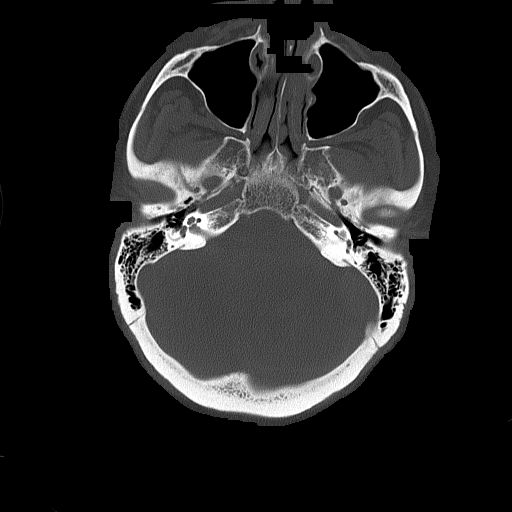
[im 26/87  bone]
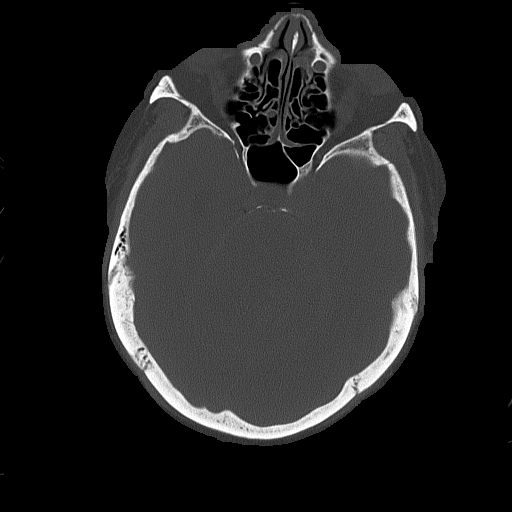

[Series 4: head without cor · coronal · non-contrast · 0.34mm/px · 3 of 69 slices shown]
[im 23/69  brain]
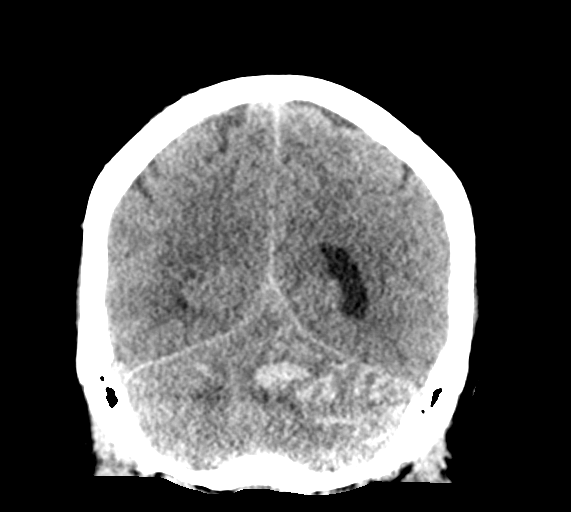
[im 31/69  brain]
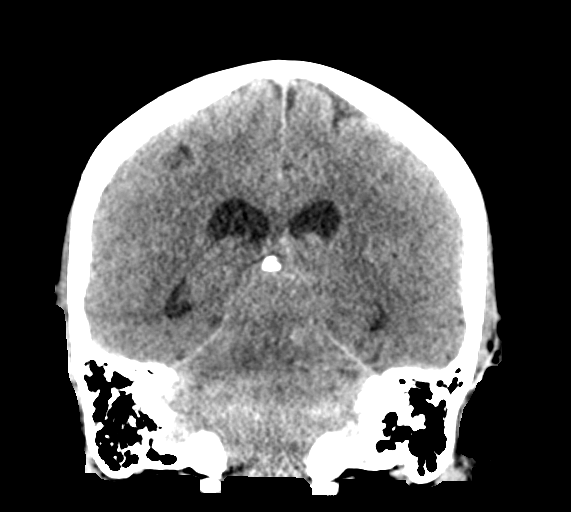
[im 38/69  brain]
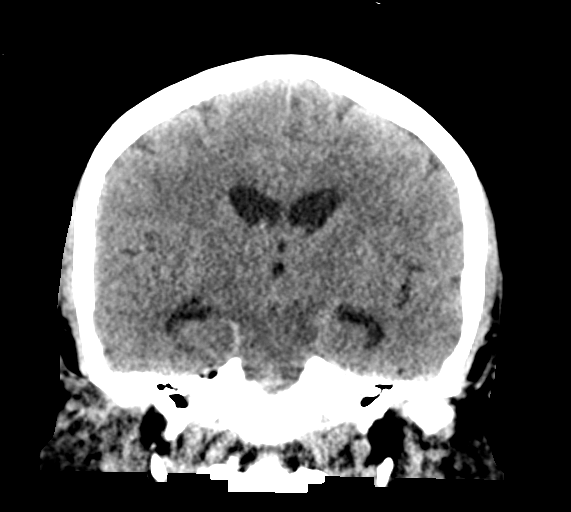

[Series 5: head without sag · sagittal · non-contrast · 0.34mm/px · 3 of 62 slices shown]
[im 21/62  brain]
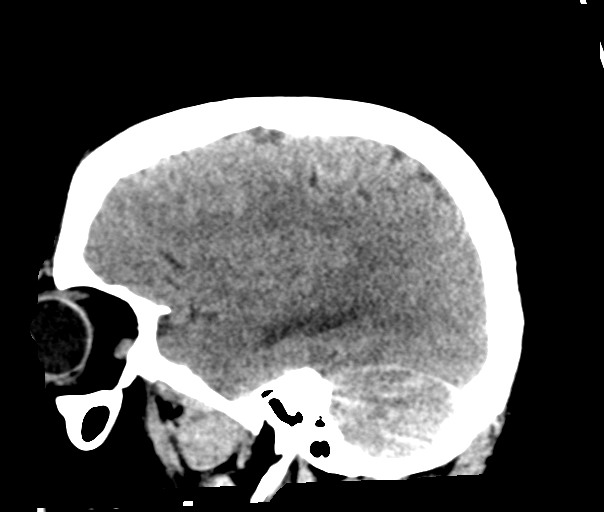
[im 31/62  brain]
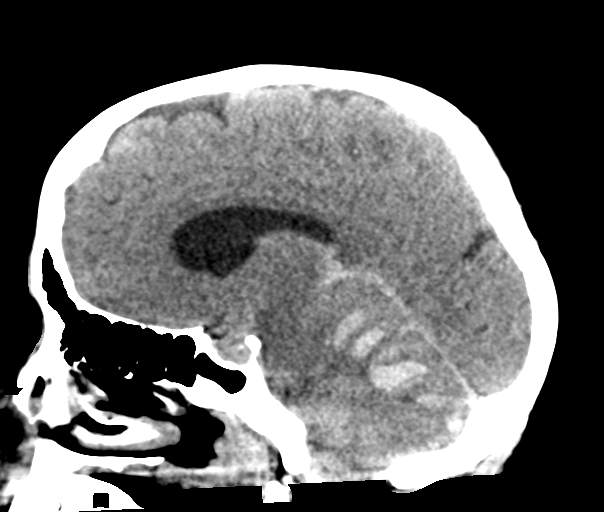
[im 41/62  brain]
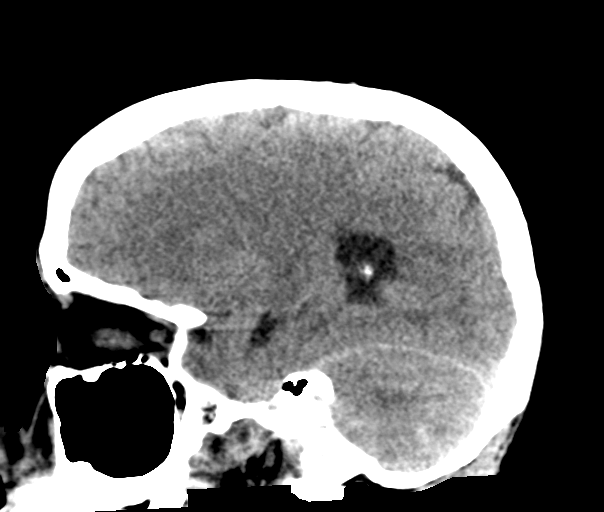

[16 of 47 positions shown; findings below may reference images not displayed]

FINDINGS: Brain: Hyperdense hemorrhage in the posterior fossa appears to
originate from intra-axial blood within the left cerebellum,
although there may be a small volume of subarachnoid extension into
the cerebellar Foley I. There is severe edema in the central
cerebellum and complete loss of basilar cisterns with mass effect on
the brainstem. Brainstem edema suspected.

Completely effaced only 4th ventricle mild, although enlargement of
the lateral and 3rd ventricles at this time. There is no lateral or
3rd intraventricular hemorrhage. No obvious 4th intraventricular
hemorrhage.

No supratentorial midline shift. Partially empty sella.
Supratentorial gray-white matter differentiation preserved at this
time.

Vascular: No suspicious intracranial vascular hyperdensity.

Skull: No osseous abnormality identified.

Sinuses/Orbits: Paranasal sinuses and mastoids are well pneumatized.

Other: Trace fluid in the pharynx. Partially visible endotracheal or
oral enteric tube. No acute orbit or scalp soft tissue finding.
Small benign right anterior frontal convexity scalp lipoma.
IMPRESSION: 1. Acute cerebellar hemorrhage on the left with moderate to severe
cerebellar and brainstem edema and complete loss of all basilar
cisterns, 4th ventricle.
Intra-axial hemorrhage origin is suspected although there appears to
be trace subarachnoid blood in the cerebellar folia.
Top differential considerations include hypertensive hemorrhage,
underlying hemorrhagic tumor or malformation.

2. Only mild lateral and 3rd ventriculomegaly suspected at this
time, and no IVH is evident.

3. Critical Value/emergent results were discussed by telephone at
the time of interpretation on 05/15/2019 at 5600 hours to Dr. TIGER
PREDRAG PELE , who verbally acknowledged these results.

## 2021-05-15 IMAGING — DX DG CHEST 1V PORT
1 series · 1 of 1 positions shown · non-contrast
Comparison: None.

CLINICAL DATA: Intubated.

EXAM:
PORTABLE CHEST 1 VIEW

[chest ap]
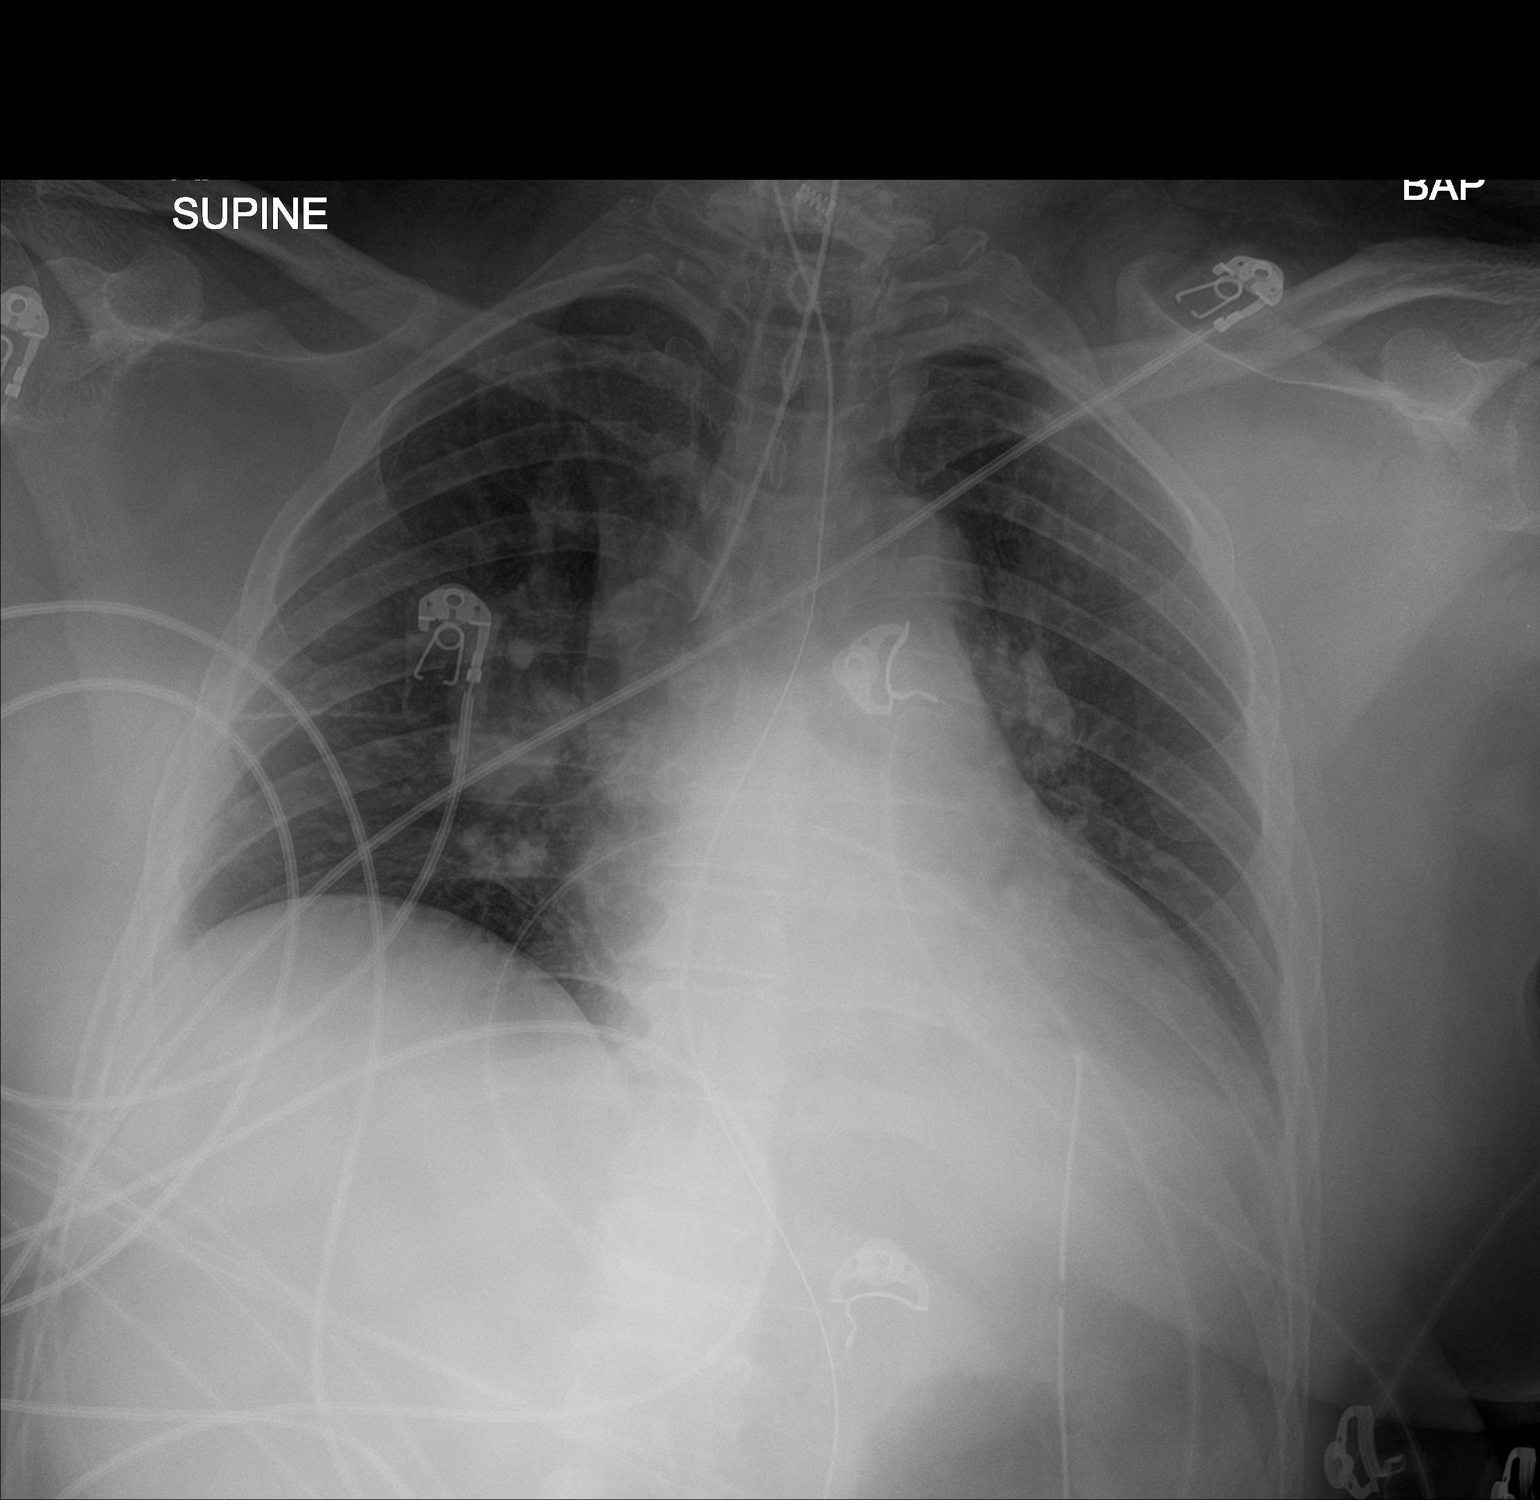

[1 of 1 positions shown; findings below may reference images not displayed]

FINDINGS: Endotracheal tube tip at the origin of the right mainstem bronchus.
Poor inspiration with prominent hila bilaterally. Mildly enlarged
heart. Clear lungs. Lower thoracic spine degenerative changes.
Nasogastric tube tip and side hole in the proximal stomach.
IMPRESSION: 1. Endotracheal tube tip at the origin of the right mainstem
bronchus. It is recommended that this be retracted 4 cm.
2. Prominent bilateral hila, most likely due to vascular prominence
with a poor inspiration. Recommend further evaluation with PA and
lateral chest radiographs when feasible.
3. Mild cardiomegaly.
